# Patient Record
Sex: Male | Born: 1965
Health system: Southern US, Community
[De-identification: ages and names within clinical notes are randomized; demographics above are authoritative.]

---

## 2008-11-23 ENCOUNTER — Encounter: Admission: RE | Admit: 2008-11-23 | Discharge: 2008-11-23 | Payer: Self-pay | Admitting: Family Medicine

## 2010-06-27 ENCOUNTER — Other Ambulatory Visit: Payer: Self-pay | Admitting: Internal Medicine

## 2010-06-27 DIAGNOSIS — M25511 Pain in right shoulder: Secondary | ICD-10-CM

## 2010-07-05 ENCOUNTER — Ambulatory Visit
Admission: RE | Admit: 2010-07-05 | Discharge: 2010-07-05 | Disposition: A | Discharge status: Home / Self Care | Payer: Worker's Compensation | Source: Ambulatory Visit | Attending: Internal Medicine | Admitting: Internal Medicine

## 2010-07-05 DIAGNOSIS — M25511 Pain in right shoulder: Secondary | ICD-10-CM

## 2012-08-12 ENCOUNTER — Other Ambulatory Visit: Payer: Self-pay | Admitting: Family Medicine

## 2012-08-12 DIAGNOSIS — R911 Solitary pulmonary nodule: Secondary | ICD-10-CM

## 2012-08-15 ENCOUNTER — Ambulatory Visit
Admission: RE | Admit: 2012-08-15 | Discharge: 2012-08-15 | Disposition: A | Discharge status: Home / Self Care | Payer: BC Managed Care – PPO | Source: Ambulatory Visit | Attending: Family Medicine | Admitting: Family Medicine

## 2012-08-15 DIAGNOSIS — R911 Solitary pulmonary nodule: Secondary | ICD-10-CM

## 2016-04-21 DIAGNOSIS — A6001 Herpesviral infection of penis: Secondary | ICD-10-CM | POA: Diagnosis not present

## 2016-04-21 DIAGNOSIS — Z23 Encounter for immunization: Secondary | ICD-10-CM | POA: Diagnosis not present

## 2016-04-21 DIAGNOSIS — Z720 Tobacco use: Secondary | ICD-10-CM | POA: Diagnosis not present

## 2016-04-21 DIAGNOSIS — L4050 Arthropathic psoriasis, unspecified: Secondary | ICD-10-CM | POA: Diagnosis not present

## 2016-04-21 DIAGNOSIS — I10 Essential (primary) hypertension: Secondary | ICD-10-CM | POA: Diagnosis not present

## 2016-04-21 DIAGNOSIS — Z13220 Encounter for screening for lipoid disorders: Secondary | ICD-10-CM | POA: Diagnosis not present

## 2016-04-21 DIAGNOSIS — Z0000 Encounter for general adult medical examination without abnormal findings: Secondary | ICD-10-CM | POA: Diagnosis not present

## 2016-04-21 DIAGNOSIS — Z125 Encounter for screening for malignant neoplasm of prostate: Secondary | ICD-10-CM | POA: Diagnosis not present

## 2017-03-26 DIAGNOSIS — A6001 Herpesviral infection of penis: Secondary | ICD-10-CM | POA: Diagnosis not present

## 2017-03-26 DIAGNOSIS — E7800 Pure hypercholesterolemia, unspecified: Secondary | ICD-10-CM | POA: Diagnosis not present

## 2017-03-26 DIAGNOSIS — L4050 Arthropathic psoriasis, unspecified: Secondary | ICD-10-CM | POA: Diagnosis not present

## 2017-03-26 DIAGNOSIS — I10 Essential (primary) hypertension: Secondary | ICD-10-CM | POA: Diagnosis not present

## 2017-08-10 DIAGNOSIS — Z125 Encounter for screening for malignant neoplasm of prostate: Secondary | ICD-10-CM | POA: Diagnosis not present

## 2017-08-10 DIAGNOSIS — Z0000 Encounter for general adult medical examination without abnormal findings: Secondary | ICD-10-CM | POA: Diagnosis not present

## 2017-08-10 DIAGNOSIS — E7800 Pure hypercholesterolemia, unspecified: Secondary | ICD-10-CM | POA: Diagnosis not present

## 2017-10-18 DIAGNOSIS — M76821 Posterior tibial tendinitis, right leg: Secondary | ICD-10-CM | POA: Diagnosis not present

## 2018-01-01 ENCOUNTER — Encounter: Payer: Self-pay | Admitting: Family Medicine

## 2018-01-01 ENCOUNTER — Ambulatory Visit (INDEPENDENT_AMBULATORY_CARE_PROVIDER_SITE_OTHER)
Admission: RE | Admit: 2018-01-01 | Discharge: 2018-01-01 | Disposition: A | Discharge status: Home / Self Care | Payer: BLUE CROSS/BLUE SHIELD | Source: Ambulatory Visit | Attending: Family Medicine | Admitting: Family Medicine

## 2018-01-01 ENCOUNTER — Ambulatory Visit (INDEPENDENT_AMBULATORY_CARE_PROVIDER_SITE_OTHER): Payer: BLUE CROSS/BLUE SHIELD | Admitting: Family Medicine

## 2018-01-01 ENCOUNTER — Ambulatory Visit: Payer: Self-pay

## 2018-01-01 VITALS — BP 118/78 | HR 47 | Wt 223.0 lb

## 2018-01-01 DIAGNOSIS — S82035A Nondisplaced transverse fracture of left patella, initial encounter for closed fracture: Secondary | ICD-10-CM

## 2018-01-01 DIAGNOSIS — M25562 Pain in left knee: Secondary | ICD-10-CM

## 2018-01-01 DIAGNOSIS — M7989 Other specified soft tissue disorders: Secondary | ICD-10-CM | POA: Diagnosis not present

## 2018-01-01 DIAGNOSIS — S82002A Unspecified fracture of left patella, initial encounter for closed fracture: Secondary | ICD-10-CM | POA: Insufficient documentation

## 2018-01-01 MED ORDER — MELOXICAM 15 MG PO TABS: 15.0000 mg | ORAL_TABLET | Freq: Every day | ORAL | 0 refills | Status: DC

## 2018-01-01 NOTE — Progress Notes (Signed)
Tawana ScaleZach Shiquita Collignon D.O. [facility] [address] [place], [place] [address] Phone: [phone] Subjective:    I Ronelle NighKana Thompson am serving as a Neurosurgeonscribe for Dr. Antoine PrimasZachary Guillaume Weninger.   CC: Left knee pain  EXB:[id number]:Subjective  Catalina LungerJames Gottwald is a 52 y.o. male coming in with complaint of left knee pain. Injured it roller skating. Last November he was injured speed skating that resulted in a concussion. States that there is a "crease at the knee cap". TTP on the lateral patella.   Onset- last Saturday night  Location- Center of knee cap, petella Character- Sore Aggravating factors- Walking  Therapies tried- [employer] Severity-7 out of 10     History reviewed. No pertinent past medical history. History reviewed. No pertinent surgical history. Social History   Socioeconomic History  . Marital status: Unknown    Spouse name: Not on file  . Number of children: Not on file  . Years of education: Not on file  . Highest education level: Not on file  Occupational History  . Not on file  Social Needs  . Financial resource strain: Not on file  . Food insecurity:    Worry: Not on file    Inability: Not on file  . Transportation needs:    Medical: Not on file    Non-medical: Not on file  Tobacco Use  . Smoking status: Current Every Day Smoker  . Smokeless tobacco: Never Used  Substance and Sexual Activity  . Alcohol use: Not on file  . Drug use: Not on file  . Sexual activity: Not on file  Lifestyle  . Physical activity:    Days per week: Not on file    Minutes per session: Not on file  . Stress: Not on file  Relationships  . Social connections:    Talks on phone: Not on file    Gets together: Not on file    Attends religious service: Not on file    Active member of club or organization: Not on file    Attends meetings of clubs or organizations: Not on file    Relationship status: Not on file  Other Topics Concern  . Not on file  Social History Narrative  . Not on file   Not  on File History reviewed. No pertinent family history.     Current Outpatient Medications (Analgesics):  .  meloxicam (MOBIC) 15 MG tablet, Take 1 tablet (15 mg total) by mouth daily.      Past medical history, social, surgical and family history all reviewed in electronic medical record.  No pertanent information unless stated regarding to the chief complaint.   Review of Systems:  No headache, visual changes, nausea, vomiting, diarrhea, constipation, dizziness, abdominal pain, skin rash, fevers, chills, night sweats, weight loss, swollen lymph nodes, body aches, joint swelling, muscle aches, chest pain, shortness of breath, mood changes.   Objective  Blood pressure 118/78, pulse (!) 47, weight 223 lb (101.2 kg), SpO2 98 %. Systems examined below as of    General: No apparent distress alert and oriented x3 mood and affect normal, dressed appropriately.  HEENT: Pupils equal, extraocular movements intact  Respiratory: Patient's speak in full sentences and does not appear short of breath  Cardiovascular: No lower extremity edema, non tender, no erythema  Skin: Warm dry intact with no signs of infection or rash on extremities or on axial skeleton.  Abdomen: Soft nontender  Neuro: Cranial nerves II through XII are intact, neurovascularly intact in all extremities with 2+  DTRs and 2+ pulses.  Lymph: No lymphadenopathy of posterior or anterior cervical chain or axillae bilaterally.  Gait antalgic MSK:  tender with limited range of motion and good stability and symmetric strength and tone of shoulders, elbows, wrist, hip, and ankles bilaterally.  Left knee exam does have some swelling noted.  Patient does have an abrasion from the recent injury.  Patient does have full range of motion but does have pain though with the last 10 degrees of flexion.  Severe tenderness over the anterior aspect of the patella especially over the lateral aspect as well.  Swelling noted.  Very boggy feeling around  the kneecap itself.  Extensor mechanism is intact.  Neurovascular intact distally.  MSK US performed of: Left knee This study was ordered, performed, and interpreted by Terrilee Files D.O.  Knee: Patient's left knee does have significant amount of swelling around the kneecap.  No true cortical defect of the kneecap noted but a hematoma on the underside of it noted.  No joint effusion noted.  IMPRESSION: Likely an occult fracture of the patella seen on ultrasound with hematoma on the superior lateral aspect     Impression and Recommendations:     This case required medical decision making of moderate complexity. The above documentation has been reviewed and is accurate and complete Judi Saa, DO       Note: This dictation was prepared with Dragon dictation along with smaller phrase technology. Any transcriptional errors that result from this process are unintentional.

## 2018-01-01 NOTE — Assessment & Plan Note (Signed)
Patient does have more of a left patellar fracture.  I believe that there is going to be underside possible OCD.  X-rays ordered today.  Brace given, patient wants to continue to work at this time.  Topical anti-inflammatories and meloxicam given.  Discussed avoiding significant amount of activity for the next 2 to 3 weeks and then we will further evaluate.

## 2018-01-01 NOTE — Patient Instructions (Addendum)
Good to see you.  Ice 20 minutes 2 times daily. Usually after activity and before bed. pennsaid pinkie amount topically 2 times daily as needed.  Melxoicam daily for 10 days then as needed Over the counter get Vitamin D 2000 IU daily  Wear the brace daily for next 2 weeks Xray downstairs See me again in 2-3 weeks (ok to double book)

## 2018-01-15 NOTE — Progress Notes (Signed)
[facility] [address] [place], [place] [address] Phone: [phone] Subjective:   Bruce Donath, am serving as a scribe for Dr. Antoine Primas.    CC: knee pain follow up   BJY:[id number]  ALANMICHAEL Hamilton is a 52 y.o. male coming in with complaint of  Knee pain.  Found to have a hematoma and was seen to have more a possible fracture over the patella.  Patient states approximately 60% better.  Mild tenderness overall.  Patient states if he does put pressure straight on the knee severe pain does occur.  Only last seconds though.  Has not been able to work out yet.     No past medical history on file. No past surgical history on file. Social History   Socioeconomic History  . Marital status: Unknown    Spouse name: Not on file  . Number of children: Not on file  . Years of education: Not on file  . Highest education level: Not on file  Occupational History  . Not on file  Social Needs  . Financial resource strain: Not on file  . Food insecurity:    Worry: Not on file    Inability: Not on file  . Transportation needs:    Medical: Not on file    Non-medical: Not on file  Tobacco Use  . Smoking status: Current Every Day Smoker  . Smokeless tobacco: Never Used  Substance and Sexual Activity  . Alcohol use: Not on file  . Drug use: Not on file  . Sexual activity: Not on file  Lifestyle  . Physical activity:    Days per week: Not on file    Minutes per session: Not on file  . Stress: Not on file  Relationships  . Social connections:    Talks on phone: Not on file    Gets together: Not on file    Attends religious service: Not on file    Active member of club or organization: Not on file    Attends meetings of clubs or organizations: Not on file    Relationship status: Not on file  Other Topics Concern  . Not on file  Social History Narrative  . Not on file   Not on File No family history on file.   Current Outpatient  Medications (Cardiovascular):  .  nitroGLYCERIN (NITRODUR - DOSED IN MG/24 HR) 0.2 mg/hr patch, 1/4 patch daily     Current Outpatient Medications (Other):  Marland Kitchen  Vitamin D, Ergocalciferol, (DRISDOL) 1.25 MG (50000 UT) CAPS capsule, Take 1 capsule (50,000 Units total) by mouth every 7 (seven) days.    Past medical history, social, surgical and family history all reviewed in electronic medical record.  No pertanent information unless stated regarding to the chief complaint.   Review of Systems:  No headache, visual changes, nausea, vomiting, diarrhea, constipation, dizziness, abdominal pain, skin rash, fevers, chills, night sweats, weight loss, swollen lymph nodes, body aches, joint swelling,  chest pain, shortness of breath, mood changes.  Positive muscle aches  Objective  Blood pressure 138/86, pulse 69, resp. rate 16, height 6\' 2"  (1.88 m), weight 227 lb (103 kg), SpO2 99 %.   General: No apparent distress alert and oriented x3 mood and affect normal, dressed appropriately.  HEENT: Pupils equal, extraocular movements intact  Respiratory: Patient's speak in full sentences and does not appear short of breath  Cardiovascular: No lower extremity edema, non tender, no erythema  Skin: Warm dry intact  with no signs of infection or rash on extremities or on axial skeleton.  Abdomen: Soft nontender  Neuro: Cranial nerves II through XII are intact, neurovascularly intact in all extremities with 2+ DTRs and 2+ pulses.  Lymph: No lymphadenopathy of posterior or anterior cervical chain or axillae bilaterally.  Gait normal with good balance and coordination.  MSK:  Non tender with full range of motion and good stability and symmetric strength and tone of shoulders, elbows, wrist, hip, and ankles bilaterally.  Knee: Left knee Normal to inspection with no erythema or effusion or obvious bony abnormalities. Palpation over the patella itself does have pain.  Does have an increased crease noted ROM full  in flexion and extension and lower leg rotation. Ligaments with solid consistent endpoints including ACL, PCL, LCL, MCL. Negative Mcmurray's, Apley's, and Thessalonian tests. Painful patellar compression Patellar glide without crepitus. Patellar and quadriceps tendons unremarkable. Hamstring and quadriceps strength is normal. Contralateral knee unremarkable  Limited musculoskeletal ultrasound was performed and interpreted by Judi SaaZachary M   Limited musculoskeletal ultrasound does show the patient does have what appears to be a small cortical defect in the patella on the superior aspect.  Horizontal but does not go straight through. Impression: Patella fracture still noted   Impression and Recommendations:     This case required medical decision making of moderate complexity. The above documentation has been reviewed and is accurate and complete Judi SaaZachary M , DO       Note: This dictation was prepared with Dragon dictation along with smaller phrase technology. Any transcriptional errors that result from this process are unintentional.

## 2018-01-16 ENCOUNTER — Ambulatory Visit: Payer: Self-pay

## 2018-01-16 ENCOUNTER — Encounter: Payer: Self-pay | Admitting: Family Medicine

## 2018-01-16 ENCOUNTER — Ambulatory Visit (INDEPENDENT_AMBULATORY_CARE_PROVIDER_SITE_OTHER): Payer: BLUE CROSS/BLUE SHIELD | Admitting: Family Medicine

## 2018-01-16 VITALS — BP 138/86 | HR 69 | Resp 16 | Ht 74.0 in | Wt 227.0 lb

## 2018-01-16 DIAGNOSIS — M25562 Pain in left knee: Secondary | ICD-10-CM

## 2018-01-16 DIAGNOSIS — S82035A Nondisplaced transverse fracture of left patella, initial encounter for closed fracture: Secondary | ICD-10-CM | POA: Diagnosis not present

## 2018-01-16 MED ORDER — NITROGLYCERIN 0.2 MG/HR TD PT24: MEDICATED_PATCH | TRANSDERMAL | 1 refills | Status: AC

## 2018-01-16 MED ORDER — VITAMIN D (ERGOCALCIFEROL) 1.25 MG (50000 UNIT) PO CAPS: 50000.0000 [IU] | ORAL_CAPSULE | ORAL | 0 refills | Status: AC

## 2018-01-16 NOTE — Assessment & Plan Note (Signed)
Improving noted.  Mild callus formation  Continue brace RTC in 4 weeks.

## 2018-01-16 NOTE — Patient Instructions (Signed)
Great to see you  A little more to go  Once weekly vitamin D for 12 weeks Nitroglycerin Protocol   Apply 1/4 nitroglycerin patch to affected area daily.  Change position of patch within the affected area every 24 hours.  You may experience a headache during the first 1-2 weeks of using the patch, these should subside.  If you experience headaches after beginning nitroglycerin patch treatment, you may take your preferred over the counter pain reliever.  Another side effect of the nitroglycerin patch is skin irritation or rash related to patch adhesive.  Please notify our office if you develop more severe headaches or rash, and stop the patch.  Tendon healing with nitroglycerin patch may require 12 to 24 weeks depending on the extent of injury.  Men should not use if taking Viagra, Cialis, or Levitra.   Do not use if you have migraines or rosacea.  Keep wearing brace with a ny type of impact  See me again in 4 weeks Happy holidays!

## 2018-02-13 NOTE — Progress Notes (Deleted)
[facility] [address] [place], [place] [address] Phone: [phone] Subjective:    I'm seeing this patient by the request  of:    CC: Knee pain follow-up  QVZ:[id number]  Adrian Hamilton is a 53 y.o. male coming in with complaint of ***  Onset-  Location Duration-  Character- Aggravating factors- Reliving factors-  Therapies tried-  Severity-     No past medical history on file. No past surgical history on file. Social History   Socioeconomic History  . Marital status: Unknown    Spouse name: Not on file  . Number of children: Not on file  . Years of education: Not on file  . Highest education level: Not on file  Occupational History  . Not on file  Social Needs  . Financial resource strain: Not on file  . Food insecurity:    Worry: Not on file    Inability: Not on file  . Transportation needs:    Medical: Not on file    Non-medical: Not on file  Tobacco Use  . Smoking status: Current Every Day Smoker  . Smokeless tobacco: Never Used  Substance and Sexual Activity  . Alcohol use: Not on file  . Drug use: Not on file  . Sexual activity: Not on file  Lifestyle  . Physical activity:    Days per week: Not on file    Minutes per session: Not on file  . Stress: Not on file  Relationships  . Social connections:    Talks on phone: Not on file    Gets together: Not on file    Attends religious service: Not on file    Active member of club or organization: Not on file    Attends meetings of clubs or organizations: Not on file    Relationship status: Not on file  Other Topics Concern  . Not on file  Social History Narrative  . Not on file   Not on File No family history on file.   Current Outpatient Medications (Cardiovascular):  .  nitroGLYCERIN (NITRODUR - DOSED IN MG/24 HR) 0.2 mg/hr patch, 1/4 patch daily     Current Outpatient Medications (Other):  Marland Kitchen  Vitamin D, Ergocalciferol, (DRISDOL) 1.25 MG (50000 UT)  CAPS capsule, Take 1 capsule (50,000 Units total) by mouth every 7 (seven) days.    Past medical history, social, surgical and family history all reviewed in electronic medical record.  No pertanent information unless stated regarding to the chief complaint.   Review of Systems:  No headache, visual changes, nausea, vomiting, diarrhea, constipation, dizziness, abdominal pain, skin rash, fevers, chills, night sweats, weight loss, swollen lymph nodes, body aches, joint swelling, muscle aches, chest pain, shortness of breath, mood changes.   Objective  There were no vitals taken for this visit. Systems examined below as of    General: No apparent distress alert and oriented x3 mood and affect normal, dressed appropriately.  HEENT: Pupils equal, extraocular movements intact  Respiratory: Patient's speak in full sentences and does not appear short of breath  Cardiovascular: No lower extremity edema, non tender, no erythema  Skin: Warm dry intact with no signs of infection or rash on extremities or on axial skeleton.  Abdomen: Soft nontender  Neuro: Cranial nerves II through XII are intact, neurovascularly intact in all extremities with 2+ DTRs and 2+ pulses.  Lymph: No lymphadenopathy of posterior or anterior cervical chain or axillae bilaterally.  Gait normal with good balance and  coordination.  MSK:  Non tender with full range of motion and good stability and symmetric strength and tone of shoulders, elbows, wrist, hip, and ankles bilaterally.  Knee: Normal to inspection with no erythema or effusion or obvious bony abnormalities. Palpation normal with no warmth, joint line tenderness, patellar tenderness, or condyle tenderness. ROM full in flexion and extension and lower leg rotation. Ligaments with solid consistent endpoints including ACL, PCL, LCL, MCL. Negative Mcmurray's, Apley's, and Thessalonian tests. Non painful patellar compression. Patellar glide without crepitus. Patellar and  quadriceps tendons unremarkable. Hamstring and quadriceps strength is normal.   Impression and Recommendations:     This case required medical decision making of moderate complexity. The above documentation has been reviewed and is accurate and complete Judi Saa, DO       Note: This dictation was prepared with Dragon dictation along with smaller phrase technology. Any transcriptional errors that result from this process are unintentional.

## 2018-02-14 ENCOUNTER — Ambulatory Visit: Payer: 59 | Admitting: Family Medicine

## 2020-08-11 ENCOUNTER — Ambulatory Visit: Payer: 59 | Attending: Internal Medicine

## 2020-08-11 DIAGNOSIS — Z20822 Contact with and (suspected) exposure to covid-19: Secondary | ICD-10-CM

## 2020-08-12 LAB — SARS-COV-2, NAA 2 DAY TAT

## 2020-08-12 LAB — NOVEL CORONAVIRUS, NAA: SARS-CoV-2, NAA: DETECTED — AB

## 2020-10-24 IMAGING — DX DG KNEE COMPLETE 4+V*L*
4 series · 4 of 4 positions shown · non-contrast
Comparison: None.

CLINICAL DATA: Posttraumatic knee pain

EXAM:
LEFT KNEE - COMPLETE 4+ VIEW

[knee ap]
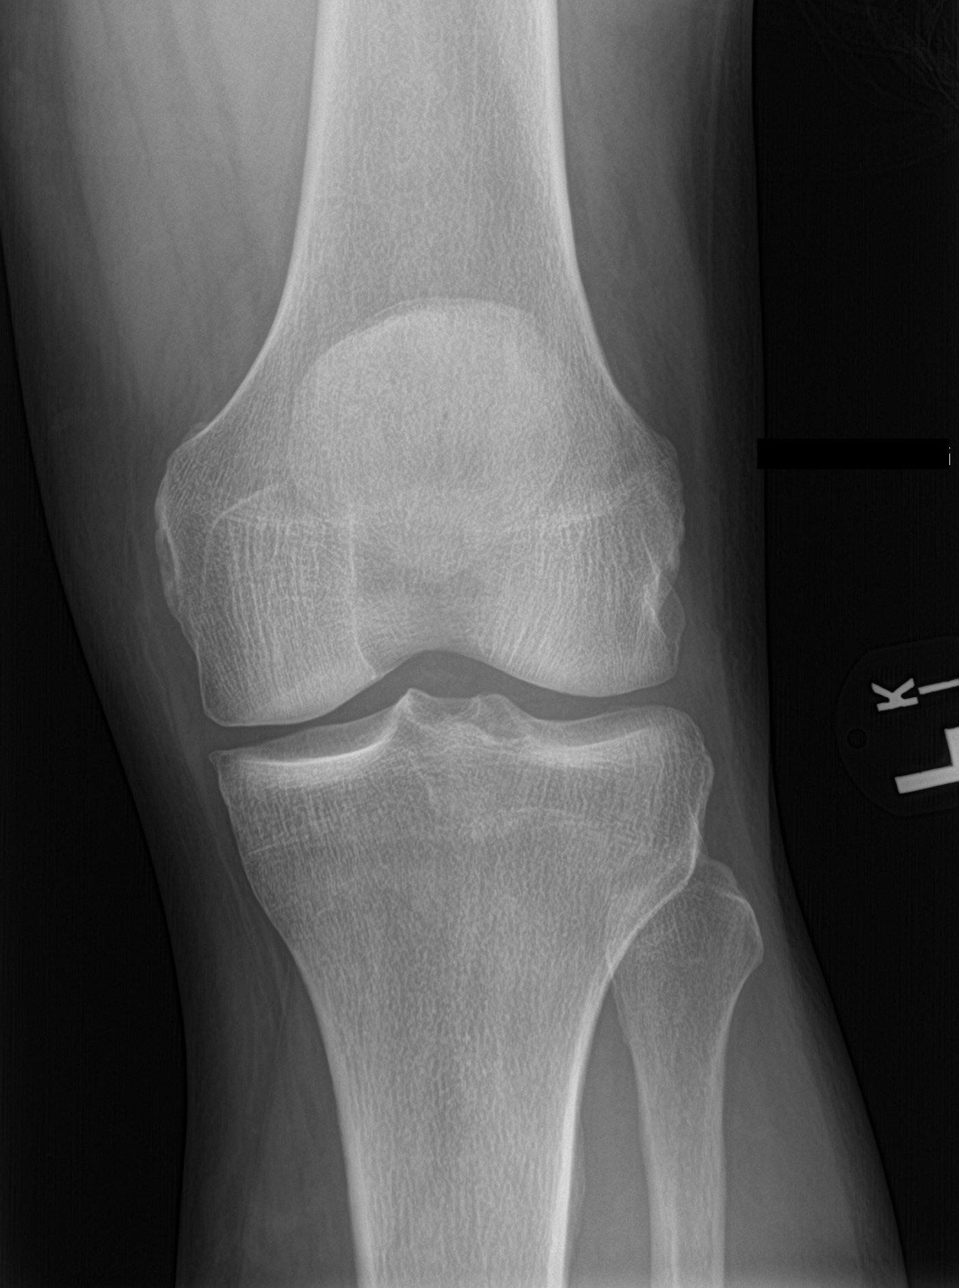

[knee tunnel]
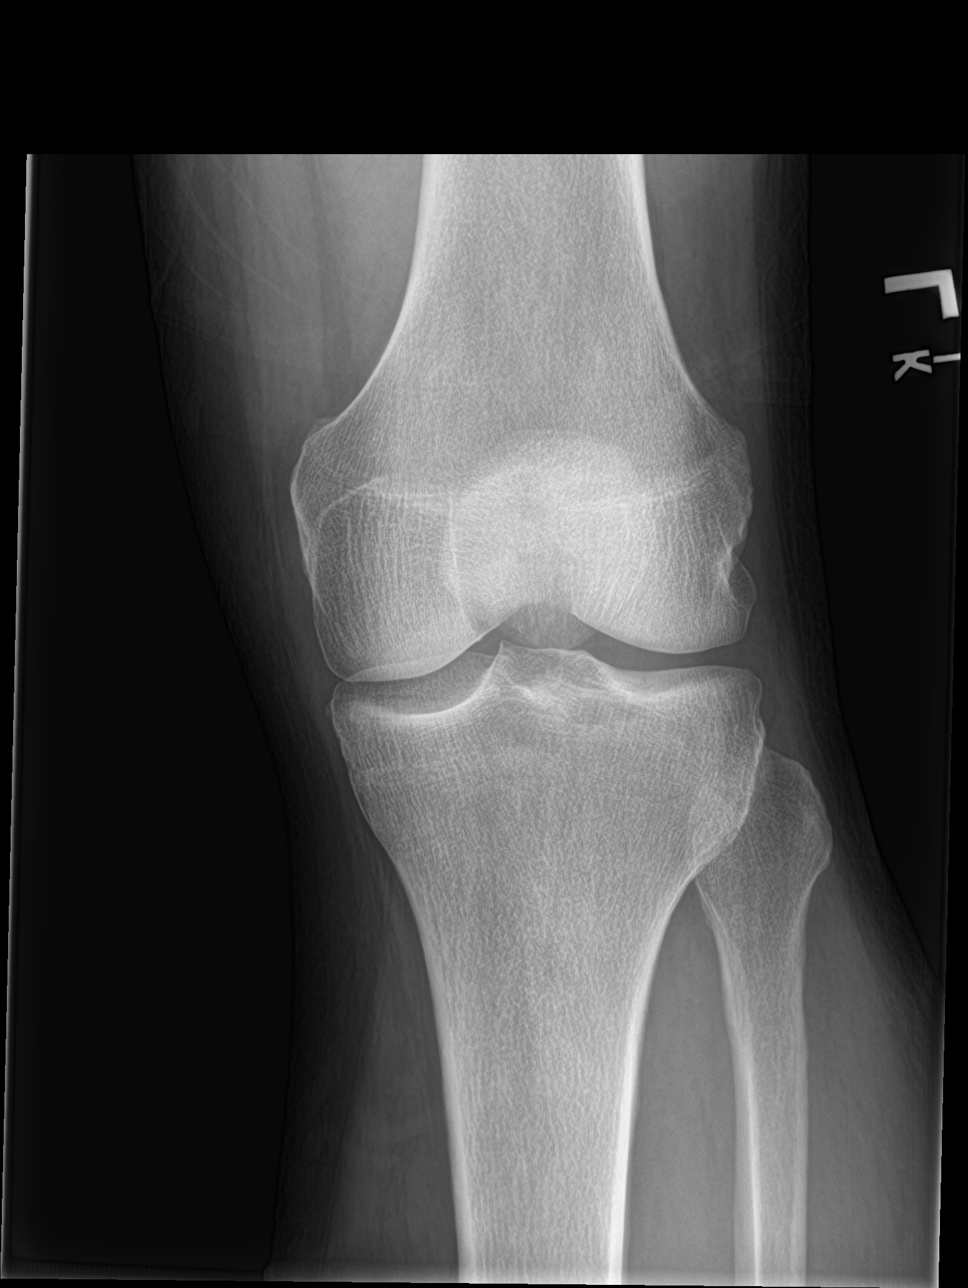

[knee lat]
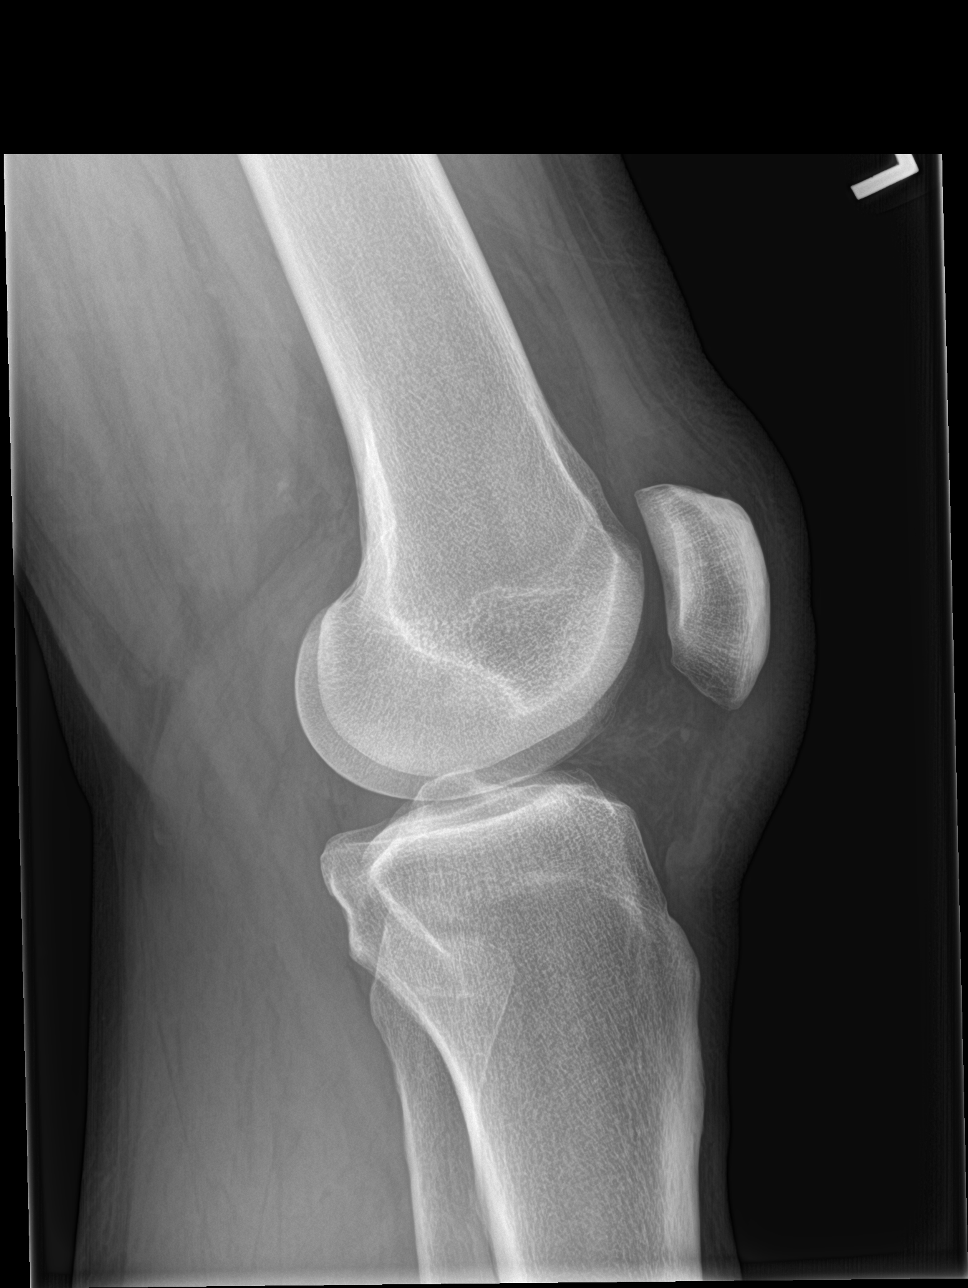

[sunrise]
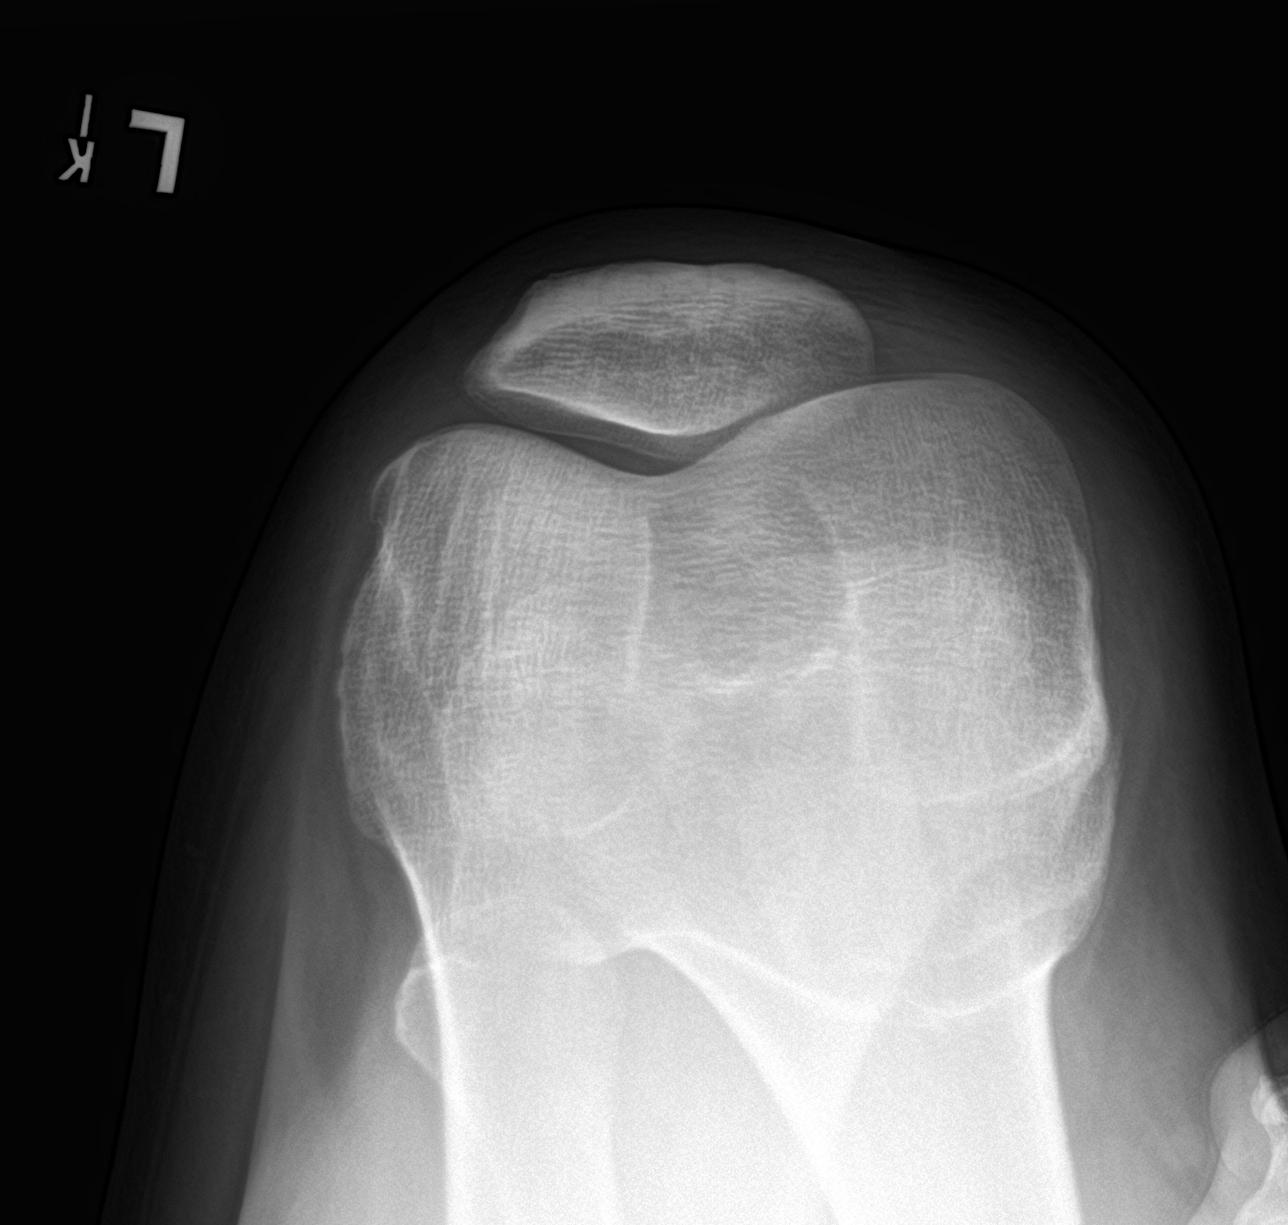

[4 of 4 positions shown; findings below may reference images not displayed]

FINDINGS: Mild anterior soft tissue swelling. No fracture or joint effusion.
Minor medial marginal spurring
IMPRESSION: Anterior soft tissue swelling without fracture or joint effusion.

## 2021-03-10 ENCOUNTER — Other Ambulatory Visit (HOSPITAL_COMMUNITY): Payer: Self-pay

## 2021-03-10 DIAGNOSIS — Z0000 Encounter for general adult medical examination without abnormal findings: Secondary | ICD-10-CM | POA: Diagnosis not present

## 2021-03-10 DIAGNOSIS — Z125 Encounter for screening for malignant neoplasm of prostate: Secondary | ICD-10-CM | POA: Diagnosis not present

## 2021-03-10 DIAGNOSIS — L409 Psoriasis, unspecified: Secondary | ICD-10-CM | POA: Diagnosis not present

## 2021-03-10 DIAGNOSIS — I10 Essential (primary) hypertension: Secondary | ICD-10-CM | POA: Diagnosis not present

## 2021-03-10 DIAGNOSIS — E7800 Pure hypercholesterolemia, unspecified: Secondary | ICD-10-CM | POA: Diagnosis not present

## 2021-03-10 DIAGNOSIS — A6001 Herpesviral infection of penis: Secondary | ICD-10-CM | POA: Diagnosis not present

## 2021-03-10 MED ORDER — VALACYCLOVIR HCL 500 MG PO TABS
ORAL_TABLET | ORAL | 3 refills | Status: DC
  Filled 2021-03-10: qty 90, 90d supply, fill #0
  Filled 2021-06-30: qty 90, 90d supply, fill #1
  Filled 2021-09-23: qty 90, 90d supply, fill #2
  Filled 2021-12-30: qty 90, 90d supply, fill #3

## 2021-03-10 MED ORDER — CLOBETASOL PROPIONATE 0.05 % EX OINT
TOPICAL_OINTMENT | CUTANEOUS | 0 refills | Status: AC
  Filled 2021-03-10: qty 60, 30d supply, fill #0

## 2021-03-10 MED ORDER — OLMESARTAN MEDOXOMIL 40 MG PO TABS
ORAL_TABLET | ORAL | 3 refills | Status: DC
  Filled 2021-03-10 – 2021-07-28 (×2): qty 90, 90d supply, fill #0
  Filled 2021-10-24: qty 90, 90d supply, fill #1
  Filled 2022-01-27: qty 90, 90d supply, fill #2

## 2021-03-10 MED ORDER — ROSUVASTATIN CALCIUM 10 MG PO TABS
ORAL_TABLET | ORAL | 3 refills | Status: AC
  Filled 2021-03-10 – 2021-09-23 (×2): qty 90, 90d supply, fill #0
  Filled 2021-12-15: qty 90, 90d supply, fill #1

## 2021-03-16 ENCOUNTER — Other Ambulatory Visit (HOSPITAL_COMMUNITY): Payer: Self-pay

## 2021-04-26 DIAGNOSIS — M25561 Pain in right knee: Secondary | ICD-10-CM | POA: Diagnosis not present

## 2021-05-11 DIAGNOSIS — M25561 Pain in right knee: Secondary | ICD-10-CM | POA: Diagnosis not present

## 2021-05-12 ENCOUNTER — Other Ambulatory Visit (HOSPITAL_COMMUNITY): Payer: Self-pay

## 2021-05-12 DIAGNOSIS — J0100 Acute maxillary sinusitis, unspecified: Secondary | ICD-10-CM | POA: Diagnosis not present

## 2021-05-12 MED ORDER — SULFAMETHOXAZOLE-TRIMETHOPRIM 800-160 MG PO TABS
ORAL_TABLET | ORAL | 0 refills | Status: AC
  Filled 2021-05-12: qty 20, 10d supply, fill #0

## 2021-05-16 DIAGNOSIS — M25561 Pain in right knee: Secondary | ICD-10-CM | POA: Diagnosis not present

## 2021-05-25 ENCOUNTER — Other Ambulatory Visit (HOSPITAL_COMMUNITY): Payer: Self-pay

## 2021-05-25 DIAGNOSIS — S83241A Other tear of medial meniscus, current injury, right knee, initial encounter: Secondary | ICD-10-CM | POA: Diagnosis not present

## 2021-05-25 DIAGNOSIS — M948X6 Other specified disorders of cartilage, lower leg: Secondary | ICD-10-CM | POA: Diagnosis not present

## 2021-05-25 DIAGNOSIS — X58XXXA Exposure to other specified factors, initial encounter: Secondary | ICD-10-CM | POA: Diagnosis not present

## 2021-05-25 DIAGNOSIS — M6751 Plica syndrome, right knee: Secondary | ICD-10-CM | POA: Diagnosis not present

## 2021-05-25 DIAGNOSIS — G8918 Other acute postprocedural pain: Secondary | ICD-10-CM | POA: Diagnosis not present

## 2021-05-25 DIAGNOSIS — S83231A Complex tear of medial meniscus, current injury, right knee, initial encounter: Secondary | ICD-10-CM | POA: Diagnosis not present

## 2021-05-25 DIAGNOSIS — Y999 Unspecified external cause status: Secondary | ICD-10-CM | POA: Diagnosis not present

## 2021-05-25 DIAGNOSIS — M1711 Unilateral primary osteoarthritis, right knee: Secondary | ICD-10-CM | POA: Diagnosis not present

## 2021-05-25 MED ORDER — CELECOXIB 200 MG PO CAPS
ORAL_CAPSULE | ORAL | 0 refills | Status: AC
  Filled 2021-05-25: qty 60, 30d supply, fill #0

## 2021-05-25 MED ORDER — ASPIRIN EC 81 MG PO TBEC
81.0000 mg | DELAYED_RELEASE_TABLET | Freq: Two times a day (BID) | ORAL | 0 refills | Status: AC
  Filled 2021-05-25: qty 60, 30d supply, fill #0

## 2021-05-25 MED ORDER — HYDROCODONE-ACETAMINOPHEN 5-325 MG PO TABS
ORAL_TABLET | ORAL | 0 refills | Status: AC
  Filled 2021-05-25 – 2021-05-26 (×2): qty 10, 2d supply, fill #0

## 2021-05-26 ENCOUNTER — Other Ambulatory Visit (HOSPITAL_COMMUNITY): Payer: Self-pay

## 2021-06-30 ENCOUNTER — Other Ambulatory Visit (HOSPITAL_COMMUNITY): Payer: Self-pay

## 2021-07-01 ENCOUNTER — Other Ambulatory Visit (HOSPITAL_COMMUNITY): Payer: Self-pay

## 2021-07-05 DIAGNOSIS — M6751 Plica syndrome, right knee: Secondary | ICD-10-CM | POA: Diagnosis not present

## 2021-07-28 ENCOUNTER — Other Ambulatory Visit (HOSPITAL_COMMUNITY): Payer: Self-pay

## 2021-09-23 ENCOUNTER — Other Ambulatory Visit (HOSPITAL_COMMUNITY): Payer: Self-pay

## 2021-10-25 ENCOUNTER — Other Ambulatory Visit (HOSPITAL_COMMUNITY): Payer: Self-pay

## 2021-10-26 ENCOUNTER — Other Ambulatory Visit (HOSPITAL_COMMUNITY): Payer: Self-pay

## 2021-12-15 ENCOUNTER — Other Ambulatory Visit (HOSPITAL_COMMUNITY): Payer: Self-pay

## 2021-12-16 ENCOUNTER — Other Ambulatory Visit (HOSPITAL_COMMUNITY): Payer: Self-pay

## 2021-12-23 ENCOUNTER — Other Ambulatory Visit (HOSPITAL_COMMUNITY): Payer: Self-pay

## 2021-12-23 MED ORDER — DOXYCYCLINE HYCLATE 100 MG PO TABS
100.0000 mg | ORAL_TABLET | Freq: Two times a day (BID) | ORAL | 0 refills | Status: AC
  Filled 2021-12-23: qty 20, 10d supply, fill #0

## 2021-12-30 ENCOUNTER — Other Ambulatory Visit (HOSPITAL_COMMUNITY): Payer: Self-pay

## 2022-01-17 ENCOUNTER — Other Ambulatory Visit (HOSPITAL_COMMUNITY): Payer: Self-pay

## 2022-01-17 MED ORDER — PREDNISONE 10 MG PO TABS
ORAL_TABLET | ORAL | 0 refills | Status: AC
  Filled 2022-01-17: qty 21, 6d supply, fill #0

## 2022-01-27 ENCOUNTER — Other Ambulatory Visit (HOSPITAL_COMMUNITY): Payer: Self-pay

## 2022-03-22 ENCOUNTER — Other Ambulatory Visit (HOSPITAL_COMMUNITY): Payer: Self-pay

## 2022-03-23 ENCOUNTER — Other Ambulatory Visit (HOSPITAL_COMMUNITY): Payer: Self-pay

## 2022-03-23 MED ORDER — VALACYCLOVIR HCL 500 MG PO TABS
500.0000 mg | ORAL_TABLET | Freq: Every day | ORAL | 0 refills | Status: DC
  Filled 2022-03-23: qty 90, 90d supply, fill #0

## 2022-03-23 MED ORDER — ROSUVASTATIN CALCIUM 20 MG PO TABS
20.0000 mg | ORAL_TABLET | Freq: Every day | ORAL | 0 refills | Status: DC
  Filled 2022-03-23: qty 90, 90d supply, fill #0

## 2022-03-28 ENCOUNTER — Other Ambulatory Visit (HOSPITAL_COMMUNITY): Payer: Self-pay

## 2022-04-27 ENCOUNTER — Other Ambulatory Visit (HOSPITAL_COMMUNITY): Payer: Self-pay

## 2022-04-28 ENCOUNTER — Other Ambulatory Visit (HOSPITAL_COMMUNITY): Payer: Self-pay

## 2022-04-28 MED ORDER — OLMESARTAN MEDOXOMIL 40 MG PO TABS
40.0000 mg | ORAL_TABLET | Freq: Every day | ORAL | 0 refills | Status: DC
  Filled 2022-04-28: qty 90, 90d supply, fill #0

## 2022-04-29 ENCOUNTER — Other Ambulatory Visit (HOSPITAL_COMMUNITY): Payer: Self-pay

## 2022-06-26 ENCOUNTER — Other Ambulatory Visit (HOSPITAL_COMMUNITY): Payer: Self-pay

## 2022-06-27 ENCOUNTER — Other Ambulatory Visit (HOSPITAL_COMMUNITY): Payer: Self-pay

## 2022-06-27 MED ORDER — ROSUVASTATIN CALCIUM 20 MG PO TABS
20.0000 mg | ORAL_TABLET | Freq: Every day | ORAL | 1 refills | Status: DC
  Filled 2022-06-27: qty 90, 90d supply, fill #0
  Filled 2022-09-26: qty 90, 90d supply, fill #1

## 2022-06-29 ENCOUNTER — Other Ambulatory Visit (HOSPITAL_COMMUNITY): Payer: Self-pay

## 2022-06-29 MED ORDER — VALACYCLOVIR HCL 500 MG PO TABS
500.0000 mg | ORAL_TABLET | Freq: Every day | ORAL | 1 refills | Status: DC
  Filled 2022-06-29: qty 90, 90d supply, fill #0
  Filled 2022-09-28: qty 90, 90d supply, fill #1

## 2022-08-03 ENCOUNTER — Other Ambulatory Visit (HOSPITAL_COMMUNITY): Payer: Self-pay

## 2022-08-07 ENCOUNTER — Other Ambulatory Visit (HOSPITAL_COMMUNITY): Payer: Self-pay

## 2022-08-07 MED ORDER — OLMESARTAN MEDOXOMIL 40 MG PO TABS
40.0000 mg | ORAL_TABLET | Freq: Every day | ORAL | 0 refills | Status: DC
  Filled 2022-08-07: qty 90, 90d supply, fill #0

## 2022-08-08 ENCOUNTER — Other Ambulatory Visit (HOSPITAL_COMMUNITY): Payer: Self-pay

## 2022-09-29 ENCOUNTER — Other Ambulatory Visit (HOSPITAL_COMMUNITY): Payer: Self-pay

## 2022-10-30 ENCOUNTER — Other Ambulatory Visit (HOSPITAL_COMMUNITY): Payer: Self-pay

## 2022-10-31 ENCOUNTER — Other Ambulatory Visit (HOSPITAL_COMMUNITY): Payer: Self-pay

## 2022-10-31 MED ORDER — CLOBETASOL PROPIONATE 0.05 % EX OINT
TOPICAL_OINTMENT | CUTANEOUS | 0 refills | Status: AC
  Filled 2022-10-31: qty 60, 20d supply, fill #0

## 2022-10-31 MED ORDER — OLMESARTAN MEDOXOMIL 40 MG PO TABS
40.0000 mg | ORAL_TABLET | Freq: Every day | ORAL | 1 refills | Status: AC
  Filled 2022-10-31: qty 90, 90d supply, fill #0
  Filled 2023-01-30: qty 90, 90d supply, fill #1

## 2022-11-01 ENCOUNTER — Other Ambulatory Visit (HOSPITAL_COMMUNITY): Payer: Self-pay

## 2023-01-02 ENCOUNTER — Other Ambulatory Visit (HOSPITAL_COMMUNITY): Payer: Self-pay

## 2023-01-03 ENCOUNTER — Other Ambulatory Visit (HOSPITAL_COMMUNITY): Payer: Self-pay

## 2023-01-03 MED ORDER — ROSUVASTATIN CALCIUM 20 MG PO TABS
ORAL_TABLET | ORAL | 0 refills | Status: DC
  Filled 2023-01-03: qty 90, 90d supply, fill #0

## 2023-01-03 MED ORDER — VALACYCLOVIR HCL 500 MG PO TABS
ORAL_TABLET | ORAL | 0 refills | Status: DC
  Filled 2023-01-03: qty 90, 90d supply, fill #0

## 2023-01-30 ENCOUNTER — Other Ambulatory Visit (HOSPITAL_COMMUNITY): Payer: Self-pay

## 2023-03-31 ENCOUNTER — Other Ambulatory Visit (HOSPITAL_COMMUNITY): Payer: Self-pay

## 2023-04-24 ENCOUNTER — Other Ambulatory Visit (HOSPITAL_COMMUNITY): Payer: Self-pay

## 2023-04-25 ENCOUNTER — Other Ambulatory Visit (HOSPITAL_COMMUNITY): Payer: Self-pay

## 2023-04-25 MED ORDER — VALACYCLOVIR HCL 500 MG PO TABS
500.0000 mg | ORAL_TABLET | Freq: Every day | ORAL | 0 refills | Status: AC
  Filled 2023-04-25: qty 90, 90d supply, fill #0

## 2023-04-25 MED ORDER — ROSUVASTATIN CALCIUM 20 MG PO TABS
20.0000 mg | ORAL_TABLET | Freq: Every day | ORAL | 0 refills | Status: AC
  Filled 2023-04-25: qty 90, 90d supply, fill #0

## 2023-04-26 ENCOUNTER — Other Ambulatory Visit (HOSPITAL_COMMUNITY): Payer: Self-pay

## 2023-05-30 ENCOUNTER — Other Ambulatory Visit (HOSPITAL_COMMUNITY): Payer: Self-pay

## 2023-05-31 ENCOUNTER — Other Ambulatory Visit (HOSPITAL_COMMUNITY): Payer: Self-pay

## 2023-05-31 MED ORDER — OLMESARTAN MEDOXOMIL 40 MG PO TABS
40.0000 mg | ORAL_TABLET | Freq: Every day | ORAL | 0 refills | Status: AC
  Filled 2023-05-31: qty 30, 30d supply, fill #0

## 2023-06-11 ENCOUNTER — Other Ambulatory Visit (HOSPITAL_COMMUNITY): Payer: Self-pay

## 2023-09-21 ENCOUNTER — Encounter (HOSPITAL_COMMUNITY): Payer: Self-pay

## 2023-09-21 ENCOUNTER — Other Ambulatory Visit (HOSPITAL_COMMUNITY): Payer: Self-pay

## 2023-09-21 MED ORDER — CLOBETASOL PROPIONATE 0.05 % EX OINT
1.0000 | TOPICAL_OINTMENT | Freq: Two times a day (BID) | CUTANEOUS | 0 refills | Status: AC
  Filled 2023-09-21: qty 30, 15d supply, fill #0
